# Patient Record
Sex: Female | Born: 1965 | Race: Black or African American | Hispanic: No | Marital: Married | Smoking: Never smoker
Health system: Southern US, Community
[De-identification: ages and names within clinical notes are randomized; demographics above are authoritative.]

## PROBLEM LIST (undated history)

## (undated) DIAGNOSIS — I10 Essential (primary) hypertension: Secondary | ICD-10-CM

---

## 1998-01-02 ENCOUNTER — Other Ambulatory Visit: Admission: RE | Admit: 1998-01-02 | Discharge: 1998-01-02 | Payer: Self-pay | Admitting: Obstetrics and Gynecology

## 1999-01-01 ENCOUNTER — Other Ambulatory Visit: Admission: RE | Admit: 1999-01-01 | Discharge: 1999-01-01 | Payer: Self-pay | Admitting: Obstetrics and Gynecology

## 2000-05-24 ENCOUNTER — Other Ambulatory Visit: Admission: RE | Admit: 2000-05-24 | Discharge: 2000-05-24 | Payer: Self-pay | Admitting: Obstetrics and Gynecology

## 2002-06-20 ENCOUNTER — Other Ambulatory Visit: Admission: RE | Admit: 2002-06-20 | Discharge: 2002-06-20 | Payer: Self-pay | Admitting: Obstetrics and Gynecology

## 2002-09-24 ENCOUNTER — Encounter (INDEPENDENT_AMBULATORY_CARE_PROVIDER_SITE_OTHER): Payer: Self-pay | Admitting: Specialist

## 2002-09-24 ENCOUNTER — Inpatient Hospital Stay (HOSPITAL_COMMUNITY): Admission: AD | Admit: 2002-09-24 | Discharge: 2002-09-26 | Payer: Self-pay | Admitting: Obstetrics and Gynecology

## 2002-10-09 ENCOUNTER — Encounter: Payer: Self-pay | Admitting: Obstetrics and Gynecology

## 2002-10-09 ENCOUNTER — Inpatient Hospital Stay (HOSPITAL_COMMUNITY): Admission: AD | Admit: 2002-10-09 | Discharge: 2002-10-09 | Payer: Self-pay | Admitting: Obstetrics and Gynecology

## 2003-02-13 ENCOUNTER — Other Ambulatory Visit: Admission: RE | Admit: 2003-02-13 | Discharge: 2003-02-13 | Payer: Self-pay | Admitting: Obstetrics and Gynecology

## 2004-04-16 ENCOUNTER — Other Ambulatory Visit: Admission: RE | Admit: 2004-04-16 | Discharge: 2004-04-16 | Payer: Self-pay | Admitting: Obstetrics and Gynecology

## 2005-05-25 ENCOUNTER — Other Ambulatory Visit: Admission: RE | Admit: 2005-05-25 | Discharge: 2005-05-25 | Payer: Self-pay | Admitting: Obstetrics and Gynecology

## 2006-09-29 ENCOUNTER — Encounter: Admission: RE | Admit: 2006-09-29 | Discharge: 2006-09-29 | Payer: Self-pay | Admitting: Obstetrics and Gynecology

## 2007-11-09 ENCOUNTER — Encounter: Admission: RE | Admit: 2007-11-09 | Discharge: 2007-11-09 | Payer: Self-pay | Admitting: Obstetrics and Gynecology

## 2009-07-07 ENCOUNTER — Encounter: Admission: RE | Admit: 2009-07-07 | Discharge: 2009-07-07 | Payer: Self-pay | Admitting: Obstetrics and Gynecology

## 2010-07-23 ENCOUNTER — Other Ambulatory Visit: Payer: Self-pay | Admitting: Obstetrics and Gynecology

## 2010-07-23 DIAGNOSIS — Z1231 Encounter for screening mammogram for malignant neoplasm of breast: Secondary | ICD-10-CM

## 2010-08-03 ENCOUNTER — Ambulatory Visit
Admission: RE | Admit: 2010-08-03 | Discharge: 2010-08-03 | Disposition: A | Discharge status: Home / Self Care | Payer: BC Managed Care – PPO | Source: Ambulatory Visit | Attending: Obstetrics and Gynecology | Admitting: Obstetrics and Gynecology

## 2010-08-03 DIAGNOSIS — Z1231 Encounter for screening mammogram for malignant neoplasm of breast: Secondary | ICD-10-CM

## 2010-10-23 NOTE — Discharge Summary (Signed)
NAMEMarland Kitchen  Bonnie Gill, Bonnie Gill                   ACCOUNT NO.:  [id number]   MEDICAL RECORD NO.:  [id number]                   PATIENT TYPE:  INP   LOCATION:  9116                                 FACILITY:  [facility]   PHYSICIAN:  Janine Limbo, M.D.            DATE OF BIRTH:  11/10/1965   DATE OF ADMISSION:  09/24/2002  DATE OF DISCHARGE:  09/26/2002                                 DISCHARGE SUMMARY   DISCHARGE DIAGNOSES:  1. Fibroid uterus (18-20 weeks size).  2. Dyspareunia.  3. Dysmenorrhea.  4. Menorrhagia.   OPERATION:  Operation on the date of admission:  Patient underwent an  abdominal myomectomy removing 15 fibroids with the largest measuring 8 cm x  6 cm.  Patient had normal-appearing left tube, right ovary and right tube  with a simple left ovarian cyst also noted.   HISTORY OF PRESENT ILLNESS:  The patient is a 45 year old female gravida 0  who presents for abdominal myomectomy because of menorrhagia, dyspareunia,  and dysmenorrhea.  Please see patient's dictated history and physical  examination for details.   PHYSICAL EXAMINATION:  VITAL SIGNS:  Weight 168 pounds.  GENERAL:  Was within normal limits.  ABDOMEN:  There is a firm mass arriving from the pelvis to the level of the  umbilicus; however, it is nontender.  PELVIC:  External genitalia is normal.  Vagina is normal .  Cervix is  anterior and difficult to examine secondary to fibroid displacement.  The  uterus is 18-20 weeks size, irregular and firm.  Adnexa without any  appreciable masses.  Rectovaginal exam confirms.   HOSPITAL COURSE:  On the day of admission, the patient underwent  aforementioned procedure tolerating it well.  Postoperative course was  unremarkable with patient resuming bowel and bladder function by post-op day  two and therefore deemed ready for discharge home.  Post-op hemoglobin was  7.4, pre-op hemoglobin 13.1.   DISCHARGE MEDICATIONS:  1. Vicodin 1-2 tablets every four to six hours  as needed for pain.  2. Iron 325 mg one tablet twice daily for six weeks.  3. Ibuprofen 600 mg one tablet with food every six hours for five days then     as needed for pain.  4. Colace 100 mg one tablet twice daily until bowel movements are regular.  5. Phenergan 25 mg one tablet every six hours if needed for nausea.   FOLLOW UP:  1. Patient is scheduled for staple removal at [facility] on     October 01, 2002 at 3 p.m.  2. She has a six week postoperative visit with Dr. Stefano Gaul on Oct 31, 2002     at 2:30 p.m.   DISCHARGE INSTRUCTIONS:  1. Patient was given a copy of [facility] postoperative     instructions sheet.  2. She was further advised to avoid driving for two weeks, heavy lifting for     four weeks, and intercourse for six  weeks.  3. Patient's diet was without restriction.   FINAL PATHOLOGY:  Not available at the time of the patient's discharge.       Elmira J. Adline Peals.                    Janine Limbo, M.D.    EJP/MEDQ  D:  09/26/2002  T:  09/27/2002  Job:  [id number]

## 2010-10-23 NOTE — H&P (Signed)
   NAME:  AYRIEL, TEXIDOR NO.:  [id number]   MEDICAL RECORD NO.:  [id number]                   PATIENT TYPE:  INP   LOCATION:  NA                                   FACILITY:  [facility]   PHYSICIAN:  Janine Limbo, M.D.            DATE OF BIRTH:  Jan 16, 1966   DATE OF ADMISSION:  DATE OF DISCHARGE:                                HISTORY & PHYSICAL   HISTORY OF PRESENT ILLNESS:  Ms. Shirlee Latch is a 45 year old female, Gravida 0,  who presents for abdominal myomectomy because of menorrhagia, dyspareunia,  dysmenorrhea and large painful fibroids. An endometrial biopsy showed benign  elements. An ultrasound showed multiple fibroids. The patient's uterus has  grown from 14-15 week size in 2001 to 18-20 week size now. The patient  reports that she can now feel the fibroids from the abdomen and that they  are beginning to interfere with her ability to perform her normal functions.   PAST OB/GYN HISTORY:  Uneventful.   PAST MEDICAL HISTORY:  The patient denies hypertension and diabetes  mellitus.   ALLERGIES:  No known drug allergies.   SOCIAL HISTORY:  The patient drinks alcohol socially. She denies cigarette  use and other recreational drug uses.   REVIEW OF SYSTEMS:  Please see history of present illness.   FAMILY HISTORY:  The patient has a family history of diabetes mellitus,  hypertension, and malignancies.   PHYSICAL EXAMINATION:  VITAL SIGNS: Weight 168 pounds.  HEENT: Within normal limits.  CHEST: Clear.  HEART: Regular rate and rhythm.  BREAST: Without masses.  ABDOMEN: Nontender but there is a firm pelvic mass present to the level of  the umbilicus.  EXTREMITIES: Within normal limits.  NEURO: Grossly normal.  PELVIC: External genitalia normal. Vagina normal. Cervix is anterior and  difficult to examine secondary to the fibroid displacement. The uterus is 18-  20 weeks size, irregular and firm. Adnexa, no masses appreciated. Vaginal  examination  confirms.   ASSESSMENT:  1. 18-20 week size fibroid uterus.  2. Dyspareunia.  3. Dysmenorrhea.  4. Menorrhagia.    PLAN:  The patient will undergo an abdominal myomectomy. She understands the  indications for her procedure and she accepts the risks of, but not limited  to, anesthetic complications, bleeding, infections, and possible damage to  the surrounding organs.                                                Janine Limbo, M.D.    AVS/MEDQ  D:  09/16/2002  T:  09/16/2002  Job:  [id number]

## 2010-10-23 NOTE — Op Note (Signed)
NAME:  Bonnie Gill, Bonnie Gill                   ACCOUNT NO.:  [id number]   MEDICAL RECORD NO.:  [id number]                   PATIENT TYPE:  INP   LOCATION:  9116                                 FACILITY:  [facility]   PHYSICIAN:  Janine Limbo, M.D.            DATE OF BIRTH:  1965/09/30   DATE OF PROCEDURE:  09/24/2002  DATE OF DISCHARGE:                                 OPERATIVE REPORT   PREOPERATIVE DIAGNOSES:  1. Fibroid uterus.  2. Menorrhagia.  3. Dysmenorrhea.   POSTOPERATIVE DIAGNOSES:  1. Fibroid uterus.  2. Menorrhagia.  3. Dysmenorrhea.   PROCEDURE:  Multiple abdominal myomectomy.   SURGEON:  Janine Limbo, M.D.   FIRST ASSISTANT:  Elmira J. Adline Peals.   ANESTHESIA:  General.   DISPOSITION:  The patient is a 45 year old female gravida 0 who presents  with the above mentioned diagnoses.  She understands the indications for her  procedure and she accepts the risks of, but not limited to, anesthetic  complications, bleeding, infections, and possible damage to the surrounding  organs.   FINDINGS:  An 18-week sized multifibroid uterus was present.  The patient  had multiple subserosal and intramural fibroids.  A total of 15 fibroids  were removed.  The weight was greater than 500 g.  The largest fibroid  measured 8 x 6 cm.  The fallopian tubes and the ovaries appeared normal.  The patient's preoperative hemoglobin was 13.1.   PROCEDURE:  The patient was taken to the operating room where a general  anesthetic was given.  The patient's abdomen, perineum, and vagina were  prepped with multiple layers of Betadine.  A Foley catheter was placed in  the bladder.  The patient was sterilely draped.  A midline incision was made  and carried sharply through the subcutaneous tissue, the fascia, and the  anterior peritoneum.  The uterus was elevated into the operative field.  Pictures were taken of the preoperative uterus.  The fibroids were injected  with a diluted  solution of Pitressin and saline.  An incision was made on  the anterior portion of the uterus and four fibroids were removed.  The  defect in the uterus was then repaired using deep and superficial sutures of  0 Vicryl followed by 3-0 Vicryl.  An incision was made across the posterior  fundus of the uterus and the remainder of the fibroids were removed save  one.  Again, the defects were closed using deep sutures of 0 Vicryl followed  by superficial sutures of 3-0 Vicryl.  Care was taken not to damage the  fallopian tubes.  There was one fibroid on the right posterior uterus and it  measured less than 1 cm in size.  This fibroid was carefully removed and the  serosa of the uterus was repaired using 3-0 Vicryl.  INTERCEED was then  placed on the incisions on the uterus.  The pelvis was irrigated.  Hemostasis was adequate throughout.  All instruments  were removed.  The  anterior peritoneum was then closed using a running suture of 3-0 Vicryl.  The fascia was then closed in a modified Smead-Jones fashion using 0 PDS.  The subcutaneous layer was irrigated and hemostasis was adequate.  The  subcutaneous layer was closed using 0 Vicryl.  The skin was reapproximated  using skin staples.  The approximate operative time was three hours.  The  patient was noted to drain clear yellow urine at the end of our procedure.  The estimated blood loss was 800 mL.  The patient tolerated her procedure  well.  She was awakened without difficulty and taken to the recovery room in  stable condition.                                               Janine Limbo, M.D.    AVS/MEDQ  D:  09/24/2002  T:  09/24/2002  Job:  [id number]

## 2011-07-16 ENCOUNTER — Other Ambulatory Visit: Payer: Self-pay | Admitting: Obstetrics and Gynecology

## 2011-07-16 DIAGNOSIS — Z1231 Encounter for screening mammogram for malignant neoplasm of breast: Secondary | ICD-10-CM

## 2011-08-09 ENCOUNTER — Ambulatory Visit: Payer: BC Managed Care – PPO

## 2011-08-16 ENCOUNTER — Ambulatory Visit
Admission: RE | Admit: 2011-08-16 | Discharge: 2011-08-16 | Disposition: A | Discharge status: Home / Self Care | Payer: BC Managed Care – PPO | Source: Ambulatory Visit | Attending: Obstetrics and Gynecology | Admitting: Obstetrics and Gynecology

## 2011-08-16 DIAGNOSIS — Z1231 Encounter for screening mammogram for malignant neoplasm of breast: Secondary | ICD-10-CM

## 2011-08-23 ENCOUNTER — Ambulatory Visit (INDEPENDENT_AMBULATORY_CARE_PROVIDER_SITE_OTHER): Payer: BC Managed Care – PPO | Admitting: Obstetrics and Gynecology

## 2011-08-23 DIAGNOSIS — Z01419 Encounter for gynecological examination (general) (routine) without abnormal findings: Secondary | ICD-10-CM

## 2012-07-26 ENCOUNTER — Other Ambulatory Visit: Payer: Self-pay | Admitting: Obstetrics and Gynecology

## 2012-07-26 DIAGNOSIS — Z1231 Encounter for screening mammogram for malignant neoplasm of breast: Secondary | ICD-10-CM

## 2012-08-22 ENCOUNTER — Ambulatory Visit
Admission: RE | Admit: 2012-08-22 | Discharge: 2012-08-22 | Disposition: A | Discharge status: Home / Self Care | Payer: BC Managed Care – PPO | Source: Ambulatory Visit | Attending: Obstetrics and Gynecology | Admitting: Obstetrics and Gynecology

## 2012-08-22 DIAGNOSIS — Z1231 Encounter for screening mammogram for malignant neoplasm of breast: Secondary | ICD-10-CM

## 2013-08-29 ENCOUNTER — Other Ambulatory Visit: Payer: Self-pay

## 2013-08-29 DIAGNOSIS — Z1231 Encounter for screening mammogram for malignant neoplasm of breast: Secondary | ICD-10-CM

## 2013-09-10 ENCOUNTER — Ambulatory Visit
Admission: RE | Admit: 2013-09-10 | Discharge: 2013-09-10 | Disposition: A | Discharge status: Home / Self Care | Payer: BC Managed Care – PPO | Source: Ambulatory Visit

## 2013-09-10 DIAGNOSIS — Z1231 Encounter for screening mammogram for malignant neoplasm of breast: Secondary | ICD-10-CM

## 2014-08-16 ENCOUNTER — Other Ambulatory Visit: Payer: Self-pay

## 2014-08-16 DIAGNOSIS — Z1231 Encounter for screening mammogram for malignant neoplasm of breast: Secondary | ICD-10-CM

## 2014-09-18 ENCOUNTER — Ambulatory Visit: Payer: BC Managed Care – PPO

## 2014-09-30 ENCOUNTER — Ambulatory Visit
Admission: RE | Admit: 2014-09-30 | Discharge: 2014-09-30 | Disposition: A | Discharge status: Home / Self Care | Payer: BC Managed Care – PPO | Source: Ambulatory Visit

## 2014-09-30 DIAGNOSIS — Z1231 Encounter for screening mammogram for malignant neoplasm of breast: Secondary | ICD-10-CM

## 2015-09-12 ENCOUNTER — Other Ambulatory Visit: Payer: Self-pay

## 2015-09-12 DIAGNOSIS — Z1231 Encounter for screening mammogram for malignant neoplasm of breast: Secondary | ICD-10-CM

## 2015-10-03 ENCOUNTER — Ambulatory Visit: Payer: BC Managed Care – PPO

## 2015-10-08 ENCOUNTER — Ambulatory Visit
Admission: RE | Admit: 2015-10-08 | Discharge: 2015-10-08 | Disposition: A | Discharge status: Home / Self Care | Payer: BC Managed Care – PPO | Source: Ambulatory Visit

## 2015-10-08 DIAGNOSIS — Z1231 Encounter for screening mammogram for malignant neoplasm of breast: Secondary | ICD-10-CM

## 2016-10-22 ENCOUNTER — Other Ambulatory Visit: Payer: Self-pay | Admitting: Obstetrics and Gynecology

## 2016-10-22 DIAGNOSIS — Z1231 Encounter for screening mammogram for malignant neoplasm of breast: Secondary | ICD-10-CM

## 2016-11-22 ENCOUNTER — Ambulatory Visit
Admission: RE | Admit: 2016-11-22 | Discharge: 2016-11-22 | Disposition: A | Discharge status: Home / Self Care | Payer: BC Managed Care – PPO | Source: Ambulatory Visit | Attending: Obstetrics and Gynecology | Admitting: Obstetrics and Gynecology

## 2016-11-22 DIAGNOSIS — Z1231 Encounter for screening mammogram for malignant neoplasm of breast: Secondary | ICD-10-CM

## 2017-11-01 ENCOUNTER — Other Ambulatory Visit: Payer: Self-pay | Admitting: Obstetrics and Gynecology

## 2017-11-01 DIAGNOSIS — Z1231 Encounter for screening mammogram for malignant neoplasm of breast: Secondary | ICD-10-CM

## 2017-11-28 ENCOUNTER — Ambulatory Visit: Payer: BC Managed Care – PPO

## 2018-01-18 ENCOUNTER — Ambulatory Visit: Payer: BC Managed Care – PPO

## 2018-02-20 ENCOUNTER — Ambulatory Visit
Admission: RE | Admit: 2018-02-20 | Discharge: 2018-02-20 | Disposition: A | Discharge status: Home / Self Care | Payer: BC Managed Care – PPO | Source: Ambulatory Visit | Attending: Obstetrics and Gynecology | Admitting: Obstetrics and Gynecology

## 2018-02-20 DIAGNOSIS — Z1231 Encounter for screening mammogram for malignant neoplasm of breast: Secondary | ICD-10-CM

## 2020-04-08 ENCOUNTER — Other Ambulatory Visit: Payer: Self-pay | Admitting: *Deleted

## 2020-04-08 DIAGNOSIS — Z1231 Encounter for screening mammogram for malignant neoplasm of breast: Secondary | ICD-10-CM

## 2020-09-06 DIAGNOSIS — E669 Obesity, unspecified: Secondary | ICD-10-CM | POA: Diagnosis not present

## 2020-09-06 DIAGNOSIS — R635 Abnormal weight gain: Secondary | ICD-10-CM | POA: Diagnosis not present

## 2020-09-06 DIAGNOSIS — R7303 Prediabetes: Secondary | ICD-10-CM | POA: Diagnosis not present

## 2020-09-06 DIAGNOSIS — E782 Mixed hyperlipidemia: Secondary | ICD-10-CM | POA: Diagnosis not present

## 2020-10-01 DIAGNOSIS — I10 Essential (primary) hypertension: Secondary | ICD-10-CM | POA: Diagnosis not present

## 2020-10-01 DIAGNOSIS — Z0000 Encounter for general adult medical examination without abnormal findings: Secondary | ICD-10-CM | POA: Diagnosis not present

## 2020-10-06 DIAGNOSIS — I10 Essential (primary) hypertension: Secondary | ICD-10-CM | POA: Diagnosis not present

## 2020-10-06 DIAGNOSIS — Z0000 Encounter for general adult medical examination without abnormal findings: Secondary | ICD-10-CM | POA: Diagnosis not present

## 2020-10-09 DIAGNOSIS — R748 Abnormal levels of other serum enzymes: Secondary | ICD-10-CM | POA: Diagnosis not present

## 2020-10-14 ENCOUNTER — Other Ambulatory Visit: Payer: Self-pay | Admitting: Obstetrics and Gynecology

## 2020-10-14 DIAGNOSIS — Z1231 Encounter for screening mammogram for malignant neoplasm of breast: Secondary | ICD-10-CM

## 2020-10-24 ENCOUNTER — Ambulatory Visit: Payer: BC Managed Care – PPO

## 2020-10-25 ENCOUNTER — Ambulatory Visit
Admission: RE | Admit: 2020-10-25 | Discharge: 2020-10-25 | Disposition: A | Discharge status: Home / Self Care | Payer: BC Managed Care – PPO | Source: Ambulatory Visit | Attending: Obstetrics and Gynecology | Admitting: Obstetrics and Gynecology

## 2020-10-25 ENCOUNTER — Other Ambulatory Visit: Payer: Self-pay

## 2020-10-25 DIAGNOSIS — Z1231 Encounter for screening mammogram for malignant neoplasm of breast: Secondary | ICD-10-CM

## 2020-11-29 DIAGNOSIS — E782 Mixed hyperlipidemia: Secondary | ICD-10-CM | POA: Diagnosis not present

## 2020-11-29 DIAGNOSIS — R635 Abnormal weight gain: Secondary | ICD-10-CM | POA: Diagnosis not present

## 2020-11-29 DIAGNOSIS — E669 Obesity, unspecified: Secondary | ICD-10-CM | POA: Diagnosis not present

## 2020-11-29 DIAGNOSIS — R7303 Prediabetes: Secondary | ICD-10-CM | POA: Diagnosis not present

## 2020-11-29 DIAGNOSIS — Z79899 Other long term (current) drug therapy: Secondary | ICD-10-CM | POA: Diagnosis not present

## 2020-12-03 DIAGNOSIS — Z79899 Other long term (current) drug therapy: Secondary | ICD-10-CM | POA: Diagnosis not present

## 2022-01-06 IMAGING — MG DIGITAL SCREENING BILAT W/ CAD
4 series · 4 of 4 positions shown · non-contrast
Comparison: Previous exam(s).

CLINICAL DATA: Screening.

EXAM:
DIGITAL SCREENING BILATERAL MAMMOGRAM WITH CAD
TECHNIQUE: Bilateral screening digital craniocaudal and mediolateral oblique
mammograms were obtained. The images were evaluated with
computer-aided detection.

[L MLO]
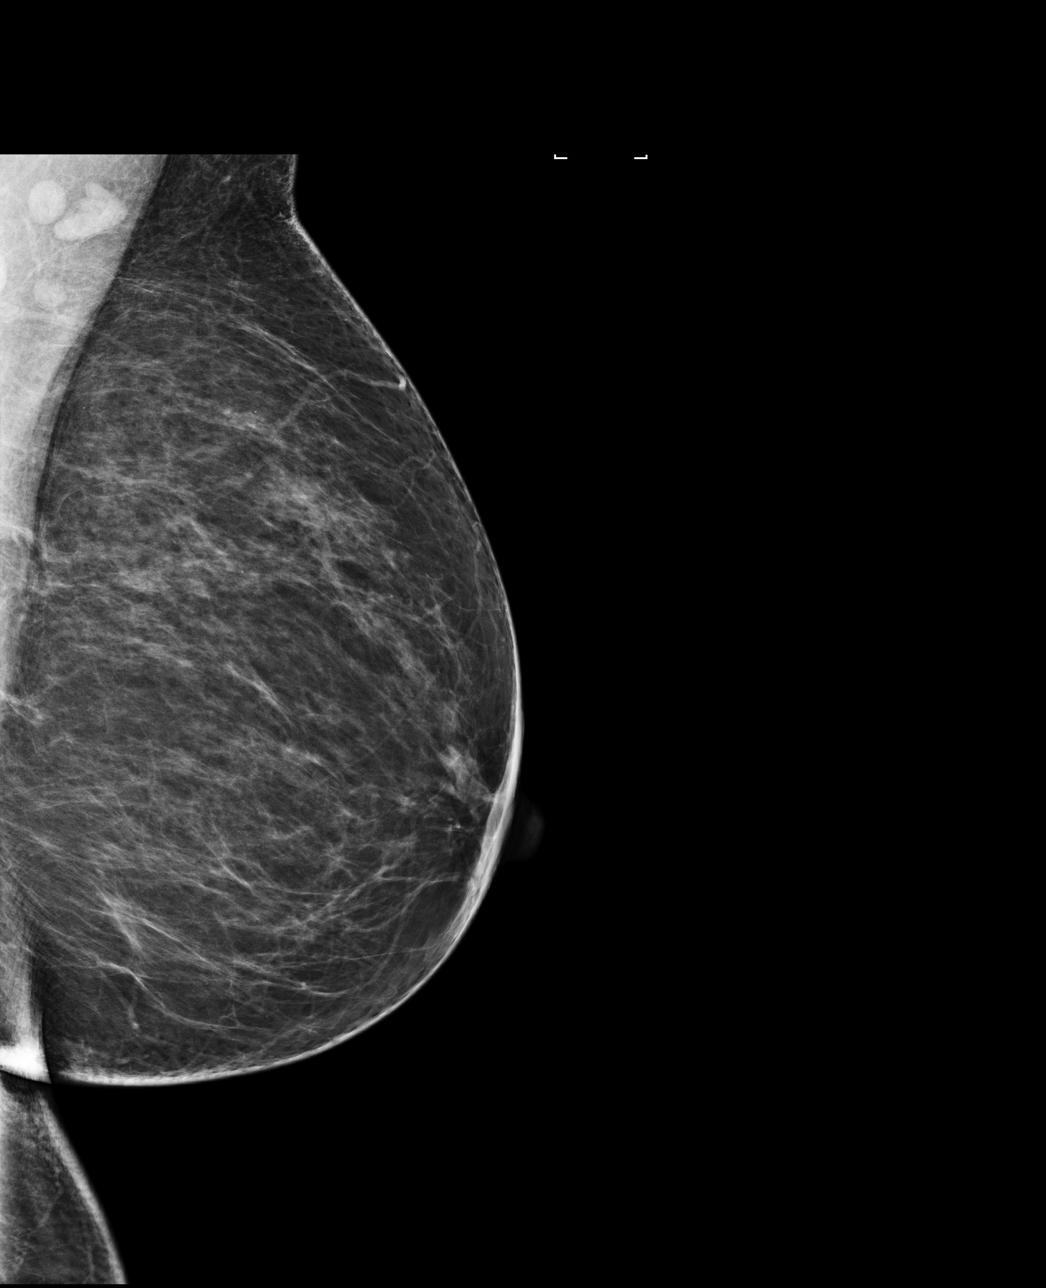

[R MLO]
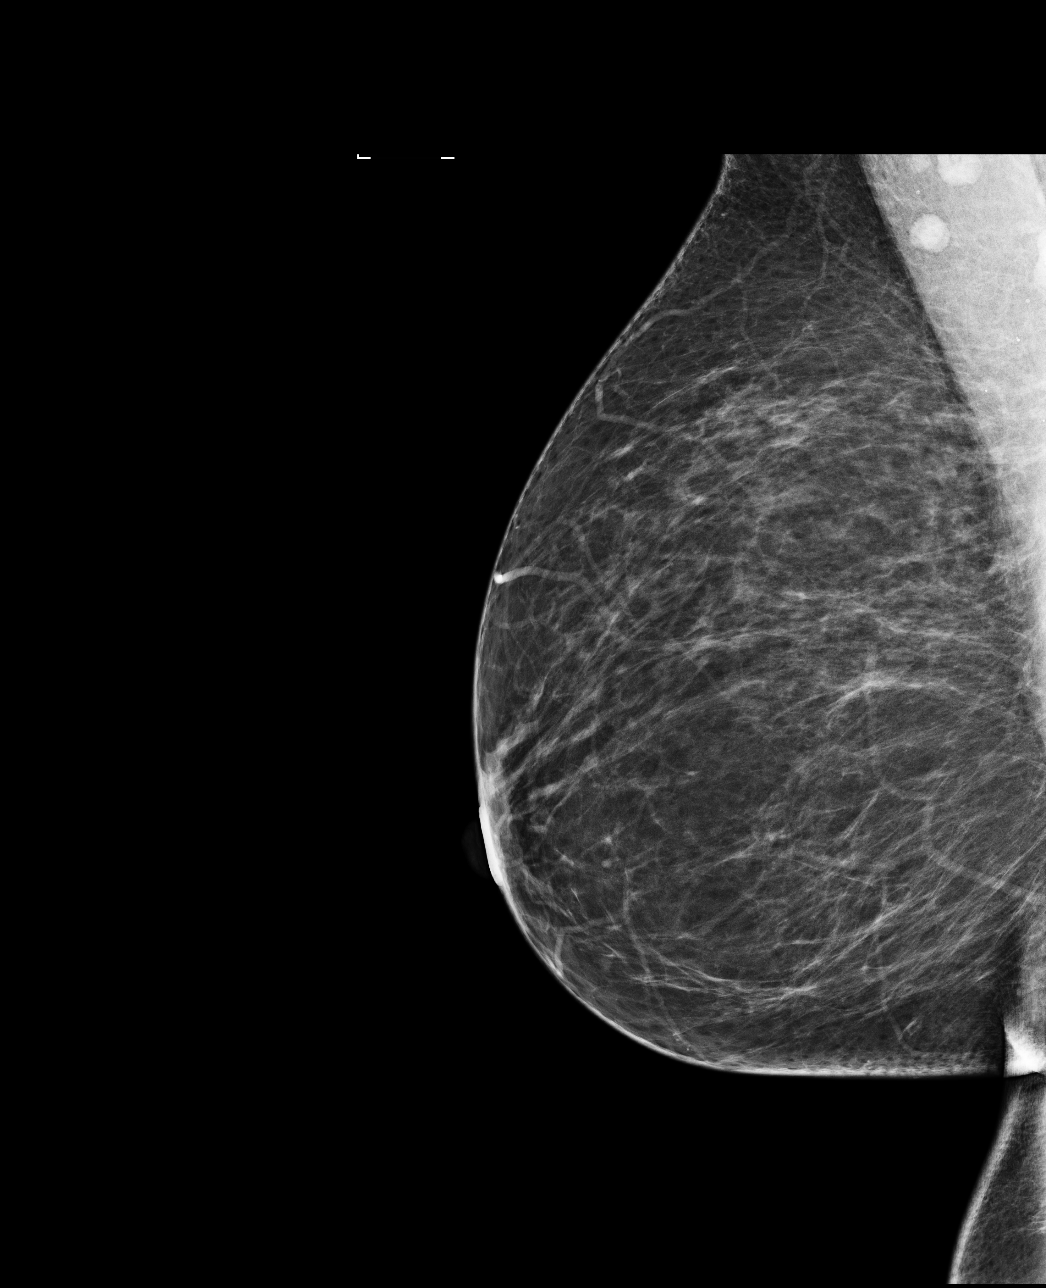

[R CC]
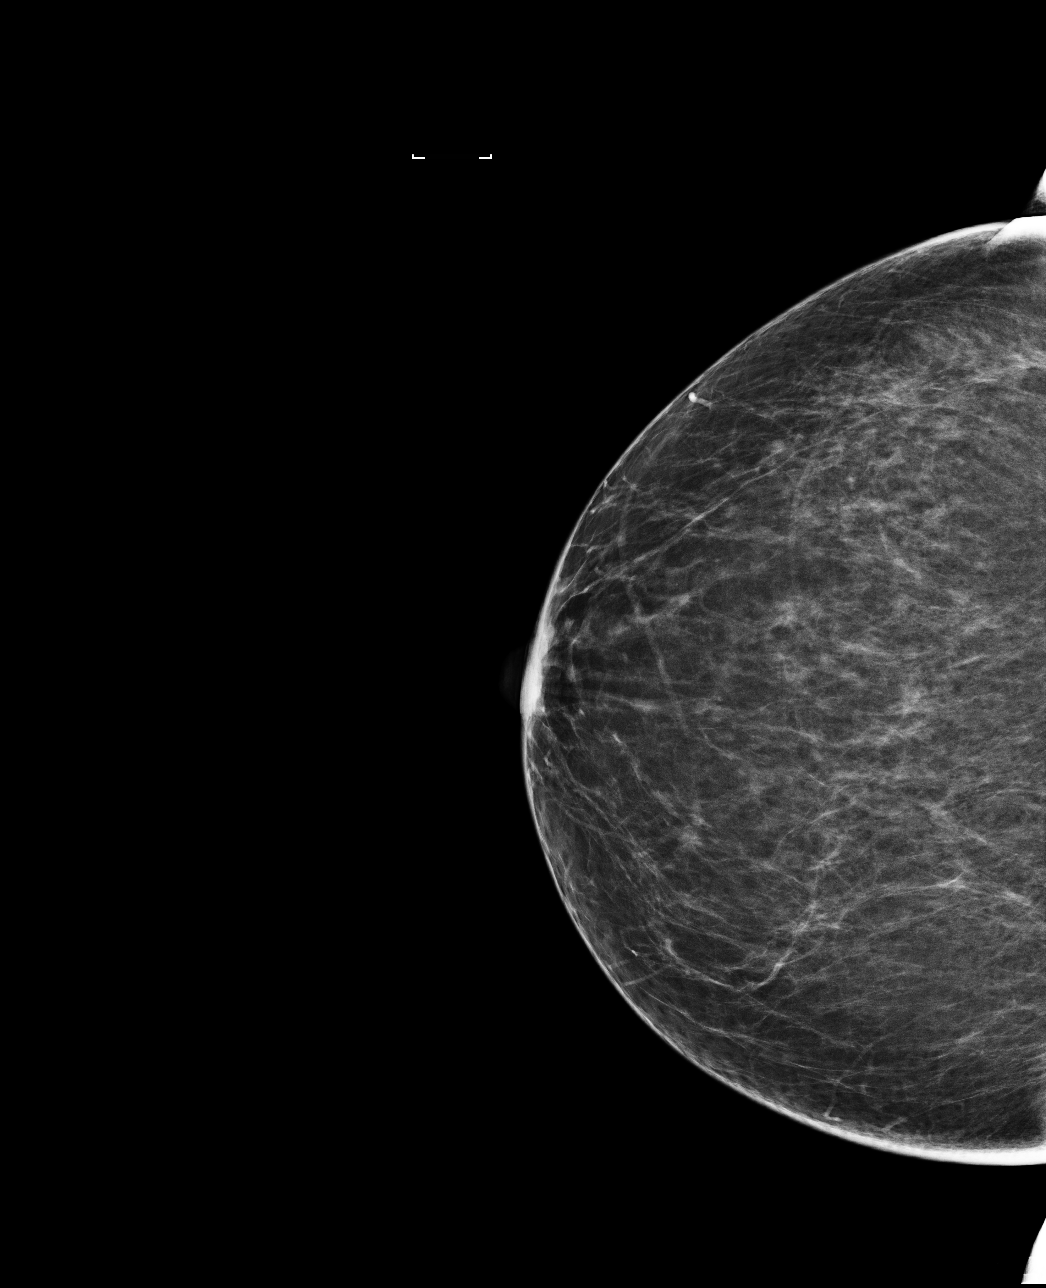

[L CC]
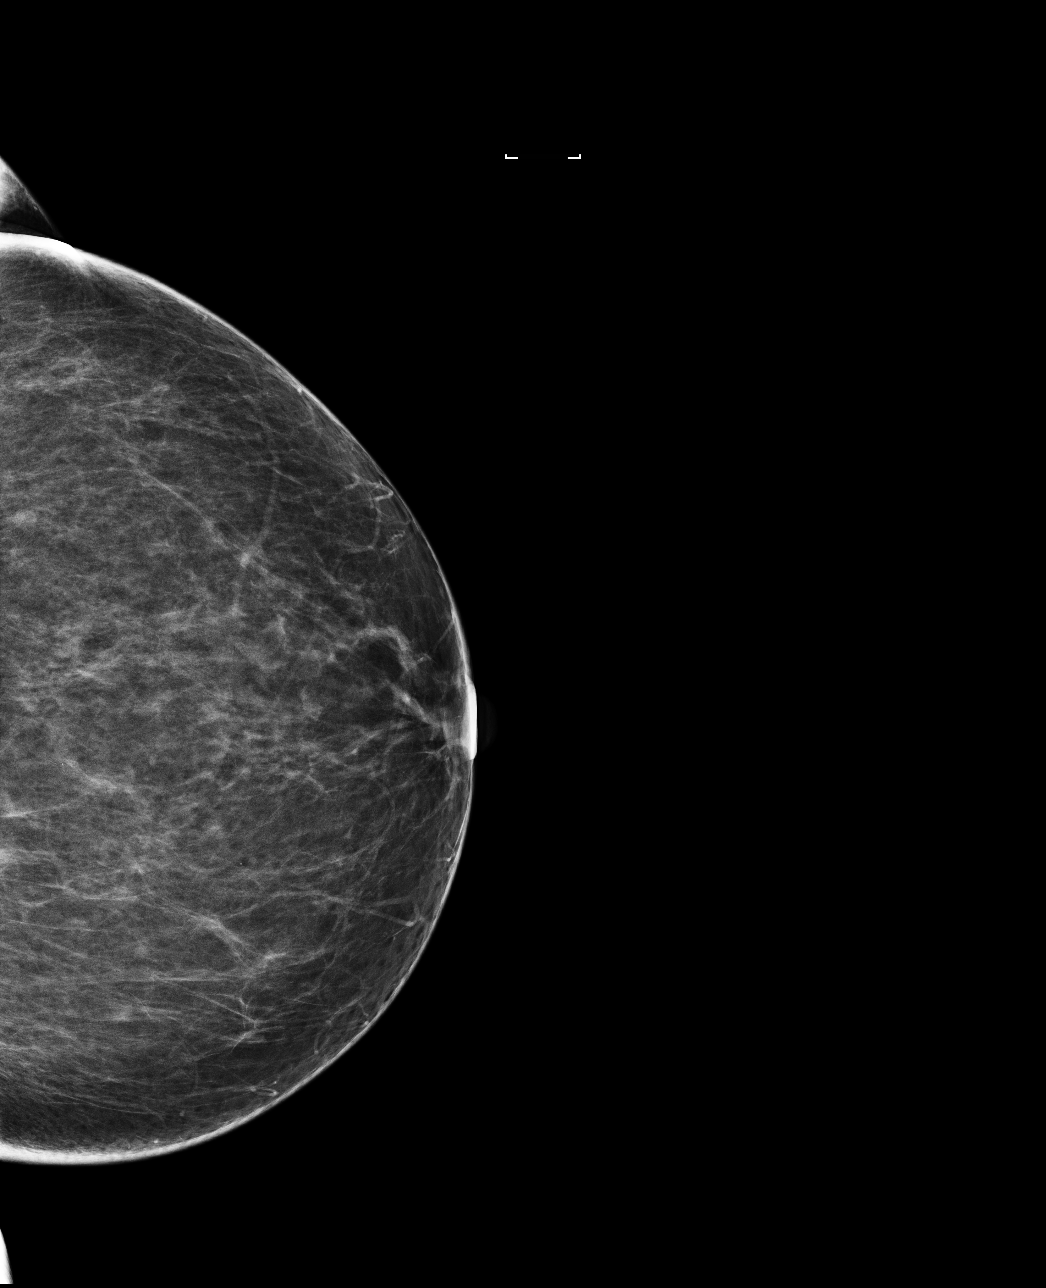

[4 of 4 positions shown; findings below may reference images not displayed]

ACR Breast Density Category b: There are scattered areas of
fibroglandular density.
FINDINGS: There are no findings suspicious for malignancy. The images were
evaluated with computer-aided detection.
IMPRESSION: No mammographic evidence of malignancy. A result letter of this
screening mammogram will be mailed directly to the patient.

RECOMMENDATION:
Screening mammogram in one year. (Code:[id number]-Q-XW6)

BI-RADS CATEGORY  1: Negative.

## 2022-03-19 ENCOUNTER — Other Ambulatory Visit: Payer: Self-pay | Admitting: Obstetrics and Gynecology

## 2022-04-07 ENCOUNTER — Other Ambulatory Visit: Payer: Self-pay | Admitting: Obstetrics and Gynecology

## 2022-04-07 DIAGNOSIS — Z1231 Encounter for screening mammogram for malignant neoplasm of breast: Secondary | ICD-10-CM

## 2022-06-04 ENCOUNTER — Ambulatory Visit
Admission: RE | Admit: 2022-06-04 | Discharge: 2022-06-04 | Disposition: A | Discharge status: Home / Self Care | Payer: BC Managed Care – PPO | Source: Ambulatory Visit | Attending: Obstetrics and Gynecology | Admitting: Obstetrics and Gynecology

## 2022-06-04 DIAGNOSIS — Z1231 Encounter for screening mammogram for malignant neoplasm of breast: Secondary | ICD-10-CM

## 2022-10-09 ENCOUNTER — Encounter (HOSPITAL_COMMUNITY): Payer: Self-pay

## 2022-10-09 ENCOUNTER — Ambulatory Visit (HOSPITAL_COMMUNITY)
Admission: RE | Admit: 2022-10-09 | Discharge: 2022-10-09 | Disposition: A | Discharge status: Home / Self Care | Payer: BC Managed Care – PPO | Source: Ambulatory Visit

## 2022-10-09 VITALS — BP 123/83 | HR 86 | Temp 98.6°F | Resp 18 | Wt 169.0 lb

## 2022-10-09 DIAGNOSIS — J069 Acute upper respiratory infection, unspecified: Secondary | ICD-10-CM

## 2022-10-09 HISTORY — DX: Essential (primary) hypertension: I10

## 2022-10-09 MED ORDER — CETIRIZINE HCL 10 MG PO TABS: 10.0000 mg | ORAL_TABLET | Freq: Every day | ORAL | 2 refills | Status: AC

## 2022-10-09 MED ORDER — BENZONATATE 100 MG PO CAPS: 100.0000 mg | ORAL_CAPSULE | Freq: Three times a day (TID) | ORAL | 0 refills | Status: AC | PRN

## 2022-10-09 NOTE — Discharge Instructions (Signed)
I recommend to continue the Mucinex DM, increase your fluid intake as this will help to thin up mucus as well  Start taking a once daily allergy medicine which can help with nasal congestion and runny nose  You can take the cough pills 3 times daily as needed.  Honey is also good for cough. Prop up at night to reduce cough

## 2022-10-09 NOTE — ED Triage Notes (Signed)
Pt states that she's having sinus congestion,cough,headache, no fever. Taking mucinex dm.

## 2022-10-09 NOTE — ED Provider Notes (Addendum)
[facility]    CSN: [id number] Arrival date & time: 10/09/22  1550      History   Chief Complaint Chief Complaint  Patient presents with   Cough   Headache   Nasal Congestion    HPI Bonnie Gill is a 57 y.o. female.  2-day history of nasal congestion, dry cough Cough causes headache Denies any fever, chills, sore throat, shortness of breath, chest pain. 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   History reviewed. No pertinent surgical history.  OB History   No obstetric history on file.      Home Medications    Prior to Admission medications   Medication Sig Start Date End Date Taking? Authorizing Provider  benzonatate (TESSALON) 100 MG capsule Take 1 capsule (100 mg total) by mouth 3 (three) times daily as needed for cough. 10/09/22  Yes Ozie Dimaria, Lurena Joiner, PA-C  cetirizine (ZYRTEC ALLERGY) 10 MG tablet Take 1 tablet (10 mg total) by mouth daily. 10/09/22  Yes Quinnley Colasurdo, Ray Church  Cholecalciferol 50 MCG (2000 UT) TABS Take 1 tablet by mouth daily.   Yes [provider]  hydrochlorothiazide (HYDRODIURIL) 25 MG tablet Take 1 tablet by mouth daily. 12/21/21  Yes [provider]    Family History History reviewed. No pertinent family history.  Social History Social History   Tobacco Use   Smoking status: Never    Passive exposure: Never   Smokeless tobacco: Never  Substance Use Topics   Alcohol use: Never   Drug use: Never     Allergies   Patient has no known allergies.   Review of Systems Review of Systems As per HPI  Physical Exam Triage Vital Signs ED Triage Vitals  Enc Vitals Group     BP 10/09/22 1608 123/83     Pulse Rate 10/09/22 1608 86     Resp 10/09/22 1608 18     Temp 10/09/22 1608 98.6 F (37 C)     Temp Source 10/09/22 1608 Oral     SpO2  10/09/22 1608 96 %     Weight 10/09/22 1605 169 lb (76.7 kg)     Height --      Head Circumference --      Peak Flow --      Pain Score 10/09/22 1605 0     Pain Loc --      Pain Edu? --      Excl. in GC? --    No data found.  Updated Vital Signs BP 123/83 (BP Location: Right Arm)   Pulse 86   Temp 98.6 F (37 C) (Oral)   Resp 18   Wt 169 lb (76.7 kg)   SpO2 96%      Physical Exam Vitals and nursing note reviewed.  Constitutional:      General: She is not in acute distress.    Comments: Hoarse voice  HENT:     Right Ear: Tympanic membrane and ear canal normal.     Left Ear: Tympanic membrane and ear canal normal.     Nose: Congestion present. No rhinorrhea.     Mouth/Throat:     Mouth: Mucous membranes are moist.     Pharynx: Oropharynx is clear. No posterior oropharyngeal erythema.  Eyes:     Conjunctiva/sclera: Conjunctivae normal.  Cardiovascular:  Rate and Rhythm: Normal rate and regular rhythm.     Pulses: Normal pulses.     Heart sounds: Normal heart sounds.  Pulmonary:     Effort: Pulmonary effort is normal.     Breath sounds: Normal breath sounds.  Abdominal:     Tenderness: There is no abdominal tenderness.  Musculoskeletal:     Cervical back: Normal range of motion.  Lymphadenopathy:     Cervical: No cervical adenopathy.  Skin:    General: Skin is warm and dry.  Neurological:     Mental Status: She is alert and oriented to person, place, and time.     UC Treatments / Results  Labs (all labs ordered are listed, but only abnormal results are displayed) Labs Reviewed - No data to display  EKG  Radiology No results found.  Procedures Procedures (including critical care time)  Medications Ordered in UC Medications - No data to display  Initial Impression / Assessment and Plan / UC Course  I have reviewed the triage vital signs and the nursing notes.  Pertinent labs & imaging results that were available during my care of the patient  were reviewed by me and considered in my medical decision making (see chart for details).  Afebrile, clear lungs Discussed likely viral etiology. No indication for xray imaging at this time. Recommend symptomatic care with once daily allergy med, continue mucinex DM. Sent tessalon to use TID prn. Understands symptoms may last several more days and cough can linger for a few weeks. Will return with any acute worsening of symptoms  Final Clinical Impressions(s) / UC Diagnoses   Final diagnoses:  Viral URI with cough     Discharge Instructions      I recommend to continue the Mucinex DM, increase your fluid intake as this will help to thin up mucus as well  Start taking a once daily allergy medicine which can help with nasal congestion and runny nose  You can take the cough pills 3 times daily as needed.  Honey is also good for cough. Prop up at night to reduce cough    ED Prescriptions     Medication Sig Dispense Auth. Provider   benzonatate (TESSALON) 100 MG capsule Take 1 capsule (100 mg total) by mouth 3 (three) times daily as needed for cough. 21 capsule Alee Gressman, PA-C   cetirizine (ZYRTEC ALLERGY) 10 MG tablet Take 1 tablet (10 mg total) by mouth daily. 30 tablet Wandy Bossler, Lurena Joiner, PA-C      PDMP not reviewed this encounter.   Aundraya Dripps, Ray Church 10/09/22 1759    Celedonio Sortino, Lurena Joiner, PA-C 10/09/22 1759

## 2023-05-23 ENCOUNTER — Other Ambulatory Visit: Payer: Self-pay | Admitting: Obstetrics and Gynecology

## 2023-05-23 DIAGNOSIS — Z1231 Encounter for screening mammogram for malignant neoplasm of breast: Secondary | ICD-10-CM

## 2023-06-17 ENCOUNTER — Ambulatory Visit
Admission: RE | Admit: 2023-06-17 | Discharge: 2023-06-17 | Disposition: A | Discharge status: Home / Self Care | Payer: 59 | Source: Ambulatory Visit | Attending: Obstetrics and Gynecology | Admitting: Obstetrics and Gynecology

## 2023-06-17 ENCOUNTER — Ambulatory Visit: Payer: BC Managed Care – PPO

## 2023-06-17 DIAGNOSIS — Z1231 Encounter for screening mammogram for malignant neoplasm of breast: Secondary | ICD-10-CM

## 2023-06-27 ENCOUNTER — Other Ambulatory Visit (HOSPITAL_COMMUNITY): Payer: Self-pay | Admitting: Registered Nurse

## 2023-06-27 DIAGNOSIS — E782 Mixed hyperlipidemia: Secondary | ICD-10-CM

## 2024-05-29 ENCOUNTER — Other Ambulatory Visit: Payer: Self-pay | Admitting: Obstetrics and Gynecology

## 2024-05-29 DIAGNOSIS — Z1231 Encounter for screening mammogram for malignant neoplasm of breast: Secondary | ICD-10-CM

## 2024-06-25 ENCOUNTER — Encounter: Payer: Self-pay | Admitting: *Deleted

## 2024-06-26 ENCOUNTER — Ambulatory Visit

## 2024-06-28 ENCOUNTER — Ambulatory Visit

## 2024-07-03 ENCOUNTER — Ambulatory Visit

## 2024-07-20 ENCOUNTER — Ambulatory Visit
